# Patient Record
Sex: Male | Born: 1960 | Race: Black or African American | Hispanic: No | Marital: Married | Smoking: Former smoker
Health system: Southern US, Community
[De-identification: ages and names within clinical notes are randomized; demographics above are authoritative.]

## PROBLEM LIST (undated history)

## (undated) DIAGNOSIS — N529 Male erectile dysfunction, unspecified: Secondary | ICD-10-CM

## (undated) DIAGNOSIS — E785 Hyperlipidemia, unspecified: Secondary | ICD-10-CM

## (undated) DIAGNOSIS — E291 Testicular hypofunction: Secondary | ICD-10-CM

## (undated) DIAGNOSIS — R7989 Other specified abnormal findings of blood chemistry: Secondary | ICD-10-CM

## (undated) DIAGNOSIS — E7800 Pure hypercholesterolemia, unspecified: Secondary | ICD-10-CM

## (undated) DIAGNOSIS — I10 Essential (primary) hypertension: Secondary | ICD-10-CM

## (undated) HISTORY — DX: Hyperlipidemia, unspecified: E78.5

## (undated) HISTORY — DX: Testicular hypofunction: E29.1

## (undated) HISTORY — DX: Male erectile dysfunction, unspecified: N52.9

## (undated) HISTORY — DX: Essential (primary) hypertension: I10

## (undated) HISTORY — DX: Pure hypercholesterolemia, unspecified: E78.00

## (undated) HISTORY — PX: WRIST SURGERY: SHX841

## (undated) HISTORY — DX: Other specified abnormal findings of blood chemistry: R79.89

---

## 2004-12-18 ENCOUNTER — Emergency Department (HOSPITAL_COMMUNITY): Admission: EM | Admit: 2004-12-18 | Discharge: 2004-12-18 | Payer: Self-pay | Admitting: Emergency Medicine

## 2004-12-18 ENCOUNTER — Inpatient Hospital Stay (HOSPITAL_COMMUNITY): Admission: RE | Admit: 2004-12-18 | Discharge: 2004-12-23 | Payer: Self-pay | Admitting: Psychiatry

## 2004-12-18 ENCOUNTER — Ambulatory Visit: Payer: Self-pay | Admitting: Psychiatry

## 2004-12-28 ENCOUNTER — Other Ambulatory Visit (HOSPITAL_COMMUNITY): Admission: RE | Admit: 2004-12-28 | Discharge: 2005-01-13 | Payer: Self-pay | Admitting: Psychiatry

## 2008-05-17 HISTORY — PX: FOOT SURGERY: SHX648

## 2009-11-25 ENCOUNTER — Encounter: Admission: RE | Admit: 2009-11-25 | Discharge: 2009-11-25 | Payer: Self-pay | Admitting: Family Medicine

## 2010-06-07 ENCOUNTER — Encounter: Payer: Self-pay | Admitting: Family Medicine
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
## 2012-03-01 ENCOUNTER — Other Ambulatory Visit: Payer: Self-pay | Admitting: Occupational Medicine

## 2012-03-01 ENCOUNTER — Ambulatory Visit: Payer: Self-pay

## 2012-03-01 DIAGNOSIS — M79645 Pain in left finger(s): Secondary | ICD-10-CM

## 2014-04-05 ENCOUNTER — Encounter: Payer: Self-pay | Admitting: Neurology

## 2014-04-05 ENCOUNTER — Ambulatory Visit (INDEPENDENT_AMBULATORY_CARE_PROVIDER_SITE_OTHER): Payer: Self-pay | Admitting: Neurology

## 2014-04-05 VITALS — BP 119/72 | HR 92 | Temp 98.4°F | Ht 73.0 in | Wt 246.0 lb

## 2014-04-05 DIAGNOSIS — Z9119 Patient's noncompliance with other medical treatment and regimen: Secondary | ICD-10-CM

## 2014-04-05 DIAGNOSIS — Z5320 Procedure and treatment not carried out because of patient's decision for unspecified reasons: Secondary | ICD-10-CM

## 2014-04-05 NOTE — Progress Notes (Deleted)
Subjective:    Patient ID: Jacob HusbandsGary Griffin is a 53 y.o. male.  HPI {Common ambulatory SmartLinks:19316}  Review of Systems  Objective:  Neurologic Exam  Physical Exam  Assessment:   ***  Plan:   ***

## 2014-04-05 NOTE — Progress Notes (Deleted)
Subjective:    Patient ID: Jacob HusbandsGary Griffin is a 53 y.o. male.  HPI {Common ambulatory SmartLinks:19316}  Review of Systems  Allergic/Immunologic: Positive for environmental allergies.    Objective:  Neurologic Exam  Physical Exam  Assessment:   ***  Plan:   ***

## 2014-04-05 NOTE — Progress Notes (Signed)
Patient's wife is upset that they are here today. She says he does not have any sleep related complaints. Patient himself denies snoring but endorses that he is tired during the day because he works 2 jobs. He works second and third shift. He denies waking up with a headache, nocturia or excessive daytime sleepiness but feels tired. Patient's wife is upset that they were referred here by the cardiologist without any reason. I suggested that we can go through with this appointment based on Dr. Verl DickerGanji's referral or I would be happy to abort the appointment and as I did not take a full history or do a full exam I would not charge him for this visit. I did ask him to make an appointment for consultation if he ever changes his mind. I talked to Dr. Jacinto HalimGanji about this after the patient left.

## 2020-05-14 ENCOUNTER — Ambulatory Visit
Admission: RE | Admit: 2020-05-14 | Discharge: 2020-05-14 | Disposition: A | Discharge status: Home / Self Care | Payer: BC Managed Care – PPO | Source: Ambulatory Visit | Attending: Nurse Practitioner | Admitting: Nurse Practitioner

## 2020-05-14 ENCOUNTER — Other Ambulatory Visit: Payer: Self-pay | Admitting: Nurse Practitioner

## 2020-05-14 ENCOUNTER — Other Ambulatory Visit: Payer: Self-pay

## 2020-05-14 DIAGNOSIS — M25512 Pain in left shoulder: Secondary | ICD-10-CM

## 2020-06-17 ENCOUNTER — Other Ambulatory Visit: Payer: Self-pay | Admitting: Orthopedic Surgery

## 2020-06-17 DIAGNOSIS — M5412 Radiculopathy, cervical region: Secondary | ICD-10-CM

## 2020-07-06 ENCOUNTER — Other Ambulatory Visit: Payer: Self-pay

## 2020-07-06 ENCOUNTER — Ambulatory Visit
Admission: RE | Admit: 2020-07-06 | Discharge: 2020-07-06 | Disposition: A | Discharge status: Home / Self Care | Payer: BC Managed Care – PPO | Source: Ambulatory Visit | Attending: Orthopedic Surgery | Admitting: Orthopedic Surgery

## 2020-07-06 DIAGNOSIS — M5412 Radiculopathy, cervical region: Secondary | ICD-10-CM

## 2022-05-17 IMAGING — MR MR CERVICAL SPINE W/O CM
4 of 5 series · 28 of 48 positions shown · non-contrast
Comparison: Radiography 05/14/2020

CLINICAL DATA: Six month history of worsening left-sided neck and
shoulder pain.

EXAM:
MRI CERVICAL SPINE WITHOUT CONTRAST
TECHNIQUE: Multiplanar, multisequence MR imaging of the cervical spine was
performed. No intravenous contrast was administered.

[Series 5: T2 · sagittal · 3.0mm · 0.55mm/px · 6 of 16 slices shown (1 of 2)]
[im 1/16]
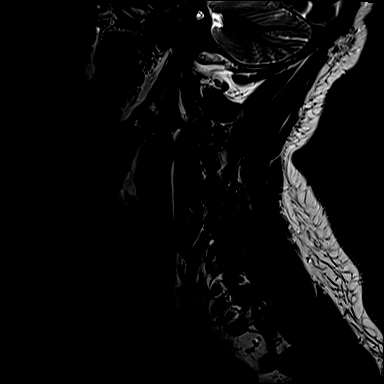
[im 4/16]
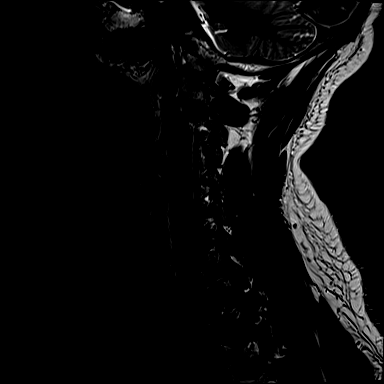
[im 7/16]
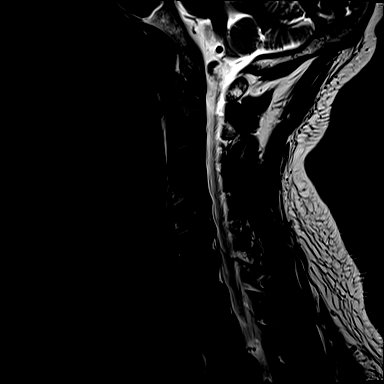
[im 10/16]
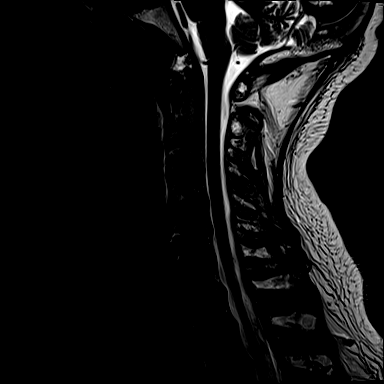
[im 13/16]
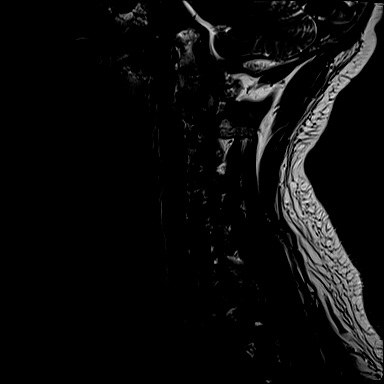
[im 16/16]
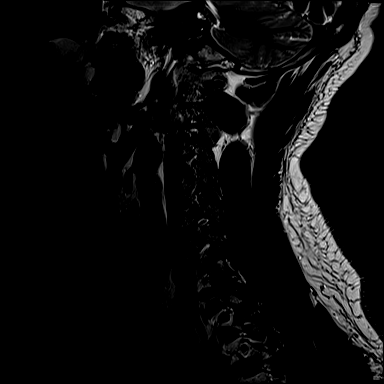

[Series 6: T1 · sagittal · 3.0mm · 0.66mm/px · 7 of 16 slices shown]
[im 1/16]
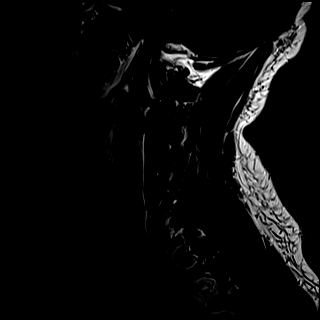
[im 3/16]
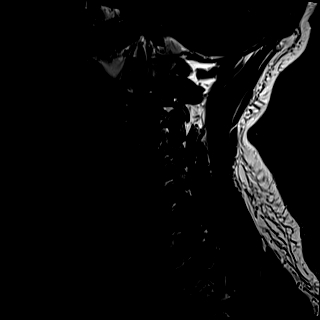
[im 6/16]
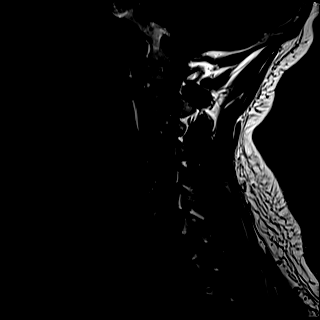
[im 8/16]
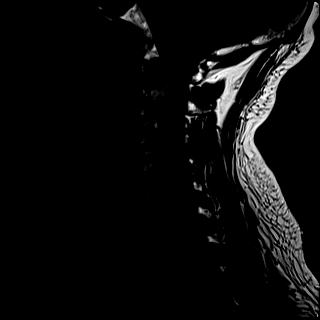
[im 11/16]
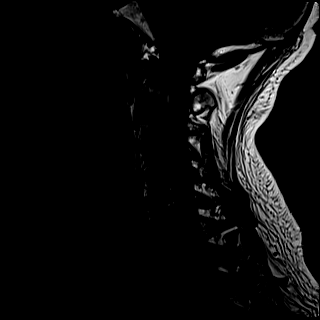
[im 13/16]
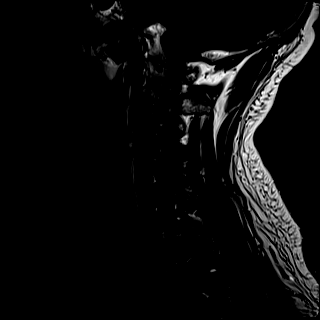
[im 16/16]
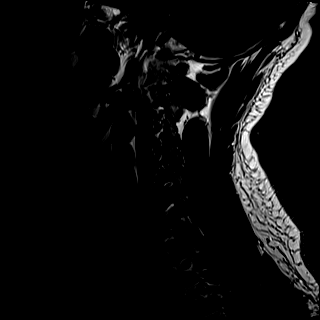

[Series 7: STIR · sagittal · 3.0mm · 0.33mm/px · 7 of 16 slices shown]
[im 1/16]
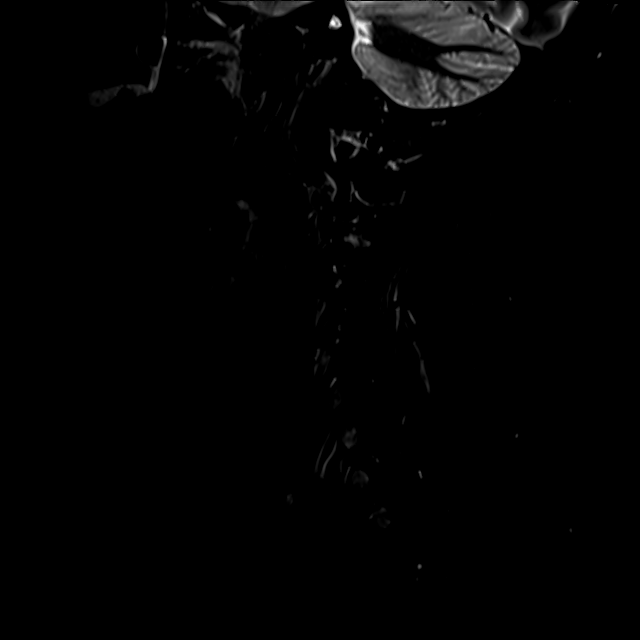
[im 3/16]
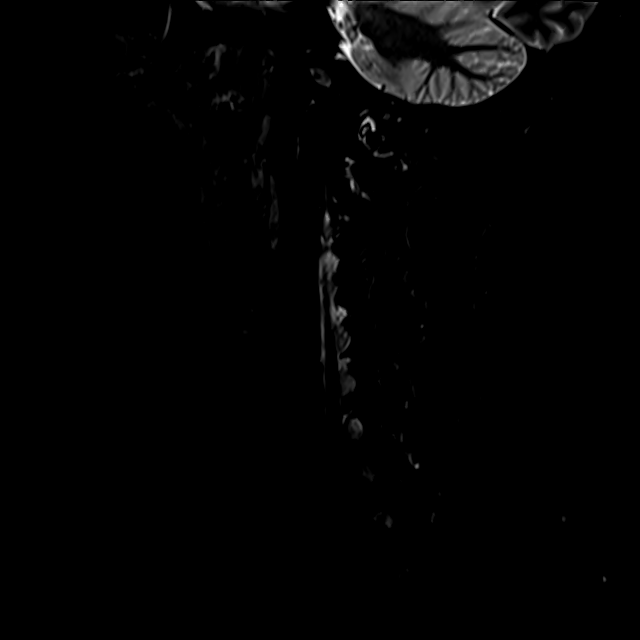
[im 6/16]
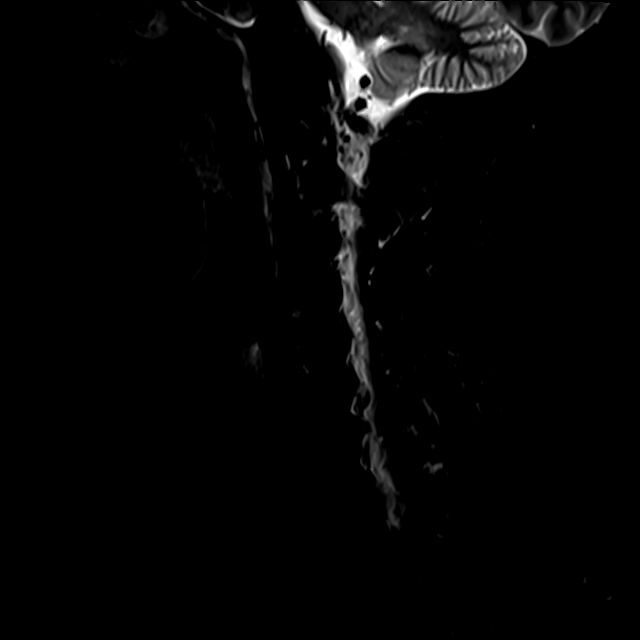
[im 8/16]
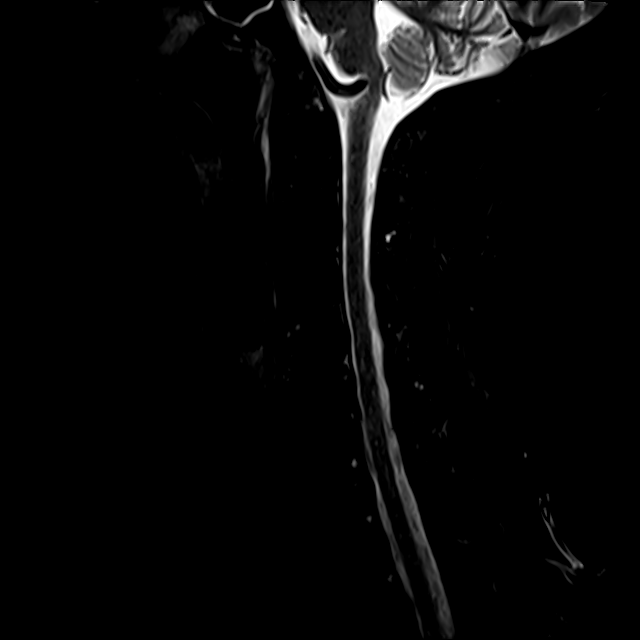
[im 11/16]
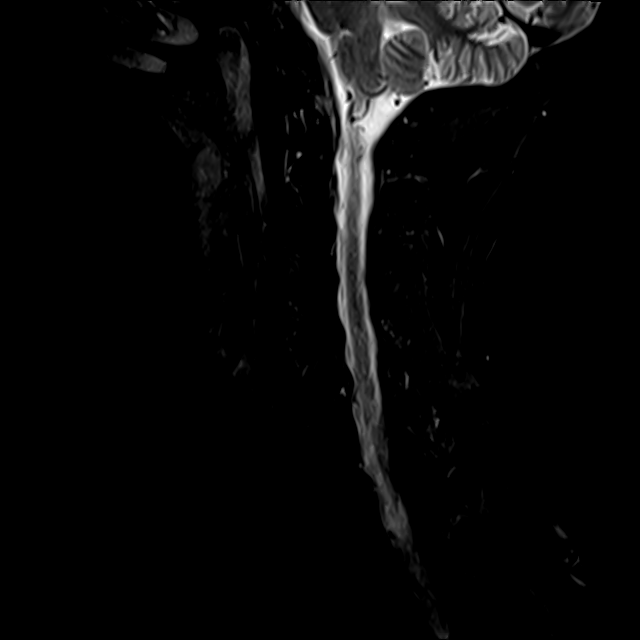
[im 13/16]
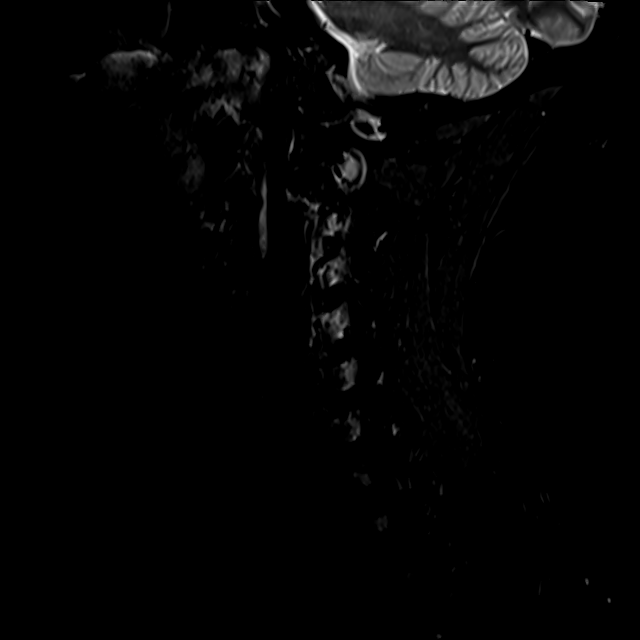
[im 16/16]
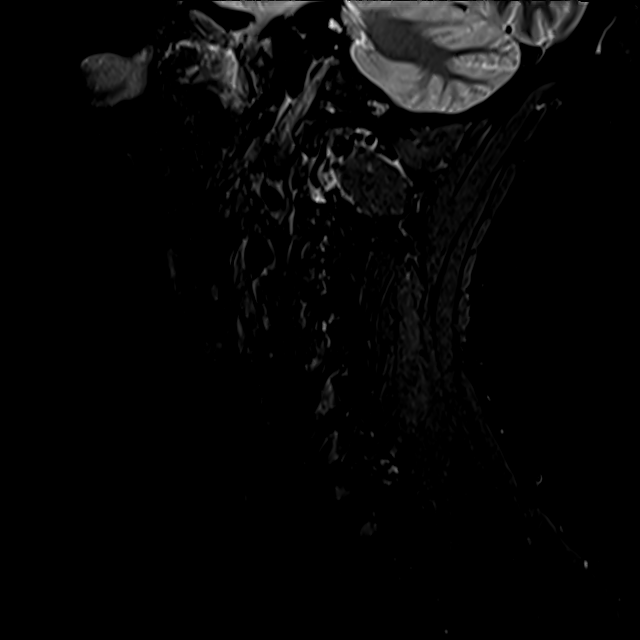

[Series 8: T2 · axial · 3.0mm · 0.62mm/px · z∈[-65,+37]mm · 8 of 34 slices shown (2 of 2)]
[im 1/34]
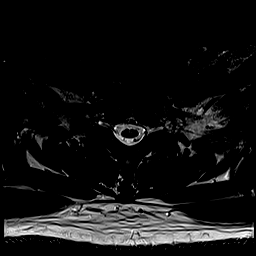
[im 6/34]
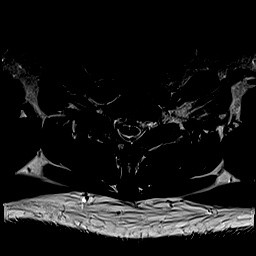
[im 11/34]
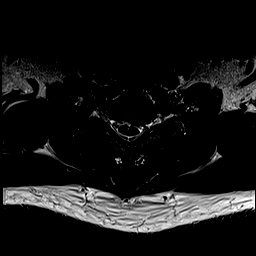
[im 16/34]
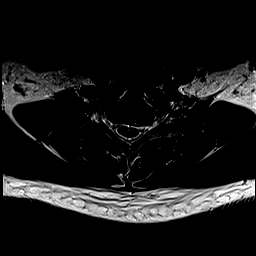
[im 18/34]
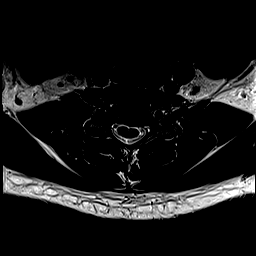
[im 23/34]
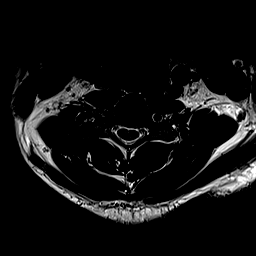
[im 28/34]
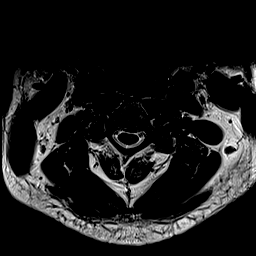
[im 34/34]
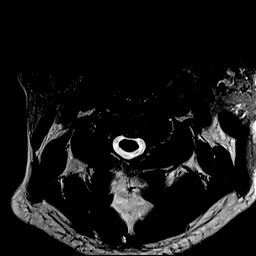

[28 of 48 positions shown; findings below may reference images not displayed]

FINDINGS: Alignment: Normal

Vertebrae: No fracture or focal bone lesion.

Cord: No cord compression or primary cord lesion.

Posterior Fossa, vertebral arteries, paraspinal tissues: Negative

Disc levels:

Foramen magnum is widely patent.  C1-2 is normal.

C2-3: Normal interspace.

C3-4: Normal interspace.

C4-5: Minimal disc bulge and uncovertebral prominence. No
compressive canal or foraminal narrowing.

C5-6: Minimal disc bulge and uncovertebral prominence. Mild
asymmetric foraminal encroachment on the left. Compression of the
left C6 nerve is not seen, but nerve irritation could conceivably
occur.

C6-7: Left posterolateral osteophyte and disc herniation with
proximal foraminal extension likely to affect the left C7 nerve.

C7-T1: Mild bulging of the disc. Facet osteoarthritis on the right
with mild edema. No compressive canal or foraminal narrowing. The
right-sided facet arthritis could be a cause on that side.
IMPRESSION: 1. Left posterolateral osteophyte and disc herniation at C6-7 with
proximal foraminal extension likely to affect the left C7 nerve.
2. Right-sided facet osteoarthritis at C7-T1 with mild edema. This
could be a cause of right-sided neck pain.
3. Minimal non-compressive spondylosis at C4-5 and C5-6. Mild
asymmetric foraminal encroachment on the left at C5-6 but without
definite neural compression.

## 2023-03-19 ENCOUNTER — Emergency Department (HOSPITAL_COMMUNITY)
Admission: EM | Admit: 2023-03-19 | Discharge: 2023-03-19 | Disposition: A | Discharge status: Home / Self Care | Payer: BC Managed Care – PPO | Attending: Emergency Medicine | Admitting: Emergency Medicine

## 2023-03-19 ENCOUNTER — Emergency Department (HOSPITAL_COMMUNITY): Payer: BC Managed Care – PPO

## 2023-03-19 ENCOUNTER — Other Ambulatory Visit: Payer: Self-pay

## 2023-03-19 ENCOUNTER — Encounter (HOSPITAL_COMMUNITY): Payer: Self-pay

## 2023-03-19 DIAGNOSIS — Y92410 Unspecified street and highway as the place of occurrence of the external cause: Secondary | ICD-10-CM | POA: Diagnosis not present

## 2023-03-19 DIAGNOSIS — I10 Essential (primary) hypertension: Secondary | ICD-10-CM | POA: Insufficient documentation

## 2023-03-19 DIAGNOSIS — Z79899 Other long term (current) drug therapy: Secondary | ICD-10-CM | POA: Insufficient documentation

## 2023-03-19 DIAGNOSIS — S5002XA Contusion of left elbow, initial encounter: Secondary | ICD-10-CM | POA: Insufficient documentation

## 2023-03-19 DIAGNOSIS — Z7982 Long term (current) use of aspirin: Secondary | ICD-10-CM | POA: Diagnosis not present

## 2023-03-19 DIAGNOSIS — S59902A Unspecified injury of left elbow, initial encounter: Secondary | ICD-10-CM | POA: Diagnosis present

## 2023-03-19 MED ORDER — NAPROXEN 500 MG PO TABS
500.0000 mg | ORAL_TABLET | Freq: Once | ORAL | Status: AC
  Administered 2023-03-19: 500 mg via ORAL
  Filled 2023-03-19: qty 1
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
